# Patient Record
Sex: Female | Born: 2000 | Race: Black or African American | Hispanic: No | Marital: Single | Smoking: Never smoker
Health system: Southern US, Community
[De-identification: ages and names within clinical notes are randomized; demographics above are authoritative.]

## PROBLEM LIST (undated history)

## (undated) DIAGNOSIS — R569 Unspecified convulsions: Secondary | ICD-10-CM

## (undated) DIAGNOSIS — S069XAA Unspecified intracranial injury with loss of consciousness status unknown, initial encounter: Secondary | ICD-10-CM

## (undated) DIAGNOSIS — S069X9A Unspecified intracranial injury with loss of consciousness of unspecified duration, initial encounter: Secondary | ICD-10-CM

---
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
## 2018-10-14 IMAGING — CT CT HEAD W/O CM
4 series · 16 of 47 positions shown, 18 images · non-contrast
Comparison: None

CLINICAL DATA: Head injury due to syncope

EXAM:
CT HEAD WITHOUT CONTRAST
CT CERVICAL SPINE WITHOUT CONTRAST
TECHNIQUE: Multidetector CT imaging of the head and cervical spine was
performed following the standard protocol without intravenous
contrast. Multiplanar CT image reconstructions of the cervical spine
were also generated.

[Series 3: head without · axial · non-contrast · 0.41mm/px · z∈[-112,+8]mm · 7 of 34 slices shown, 9 images]
[im 5/34  brain]
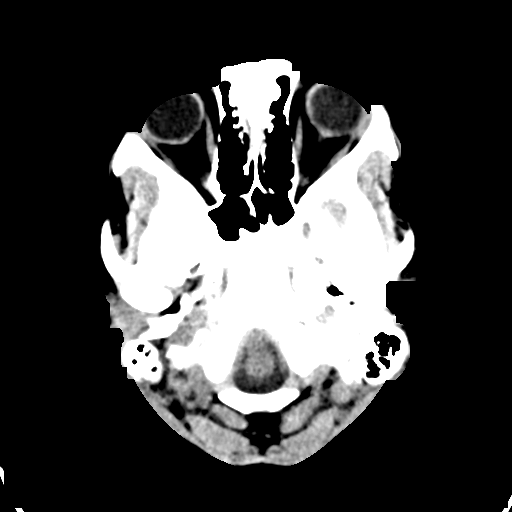
[im 5/34  bone]
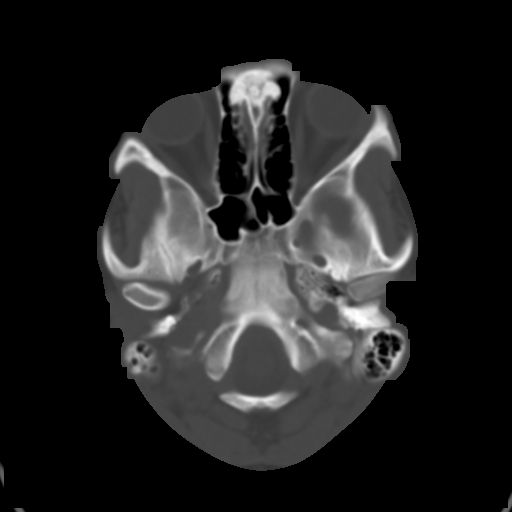
[im 9/34  brain]
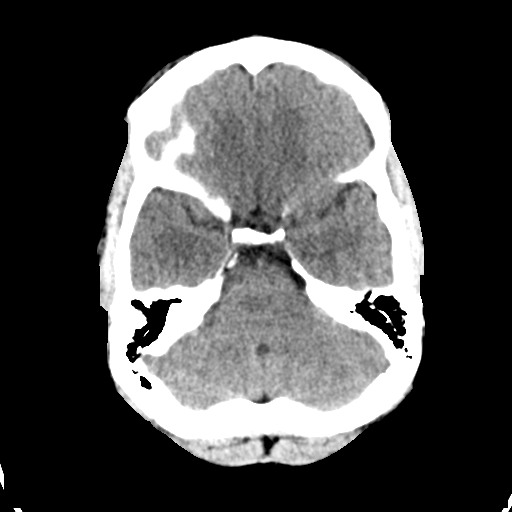
[im 13/34  brain]
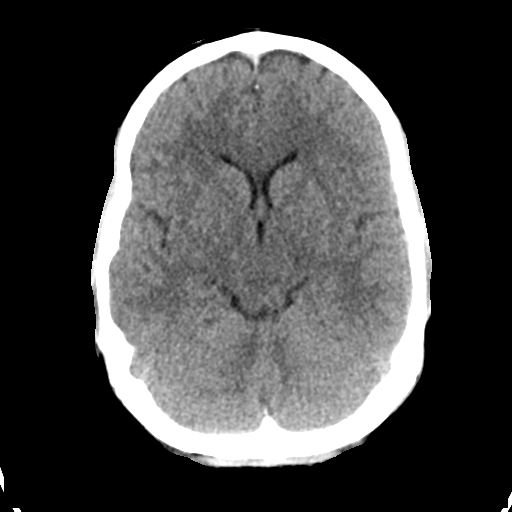
[im 17/34  brain]
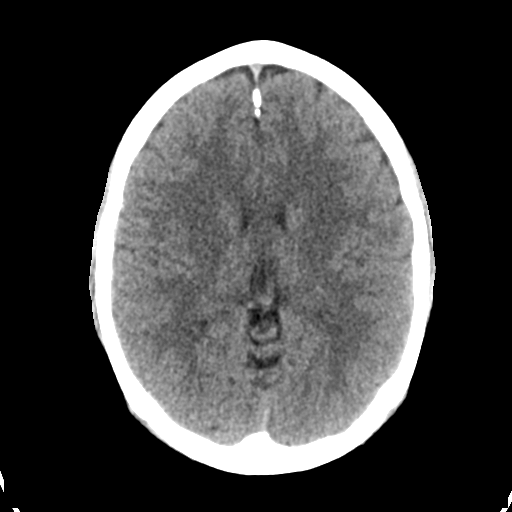
[im 21/34  brain]
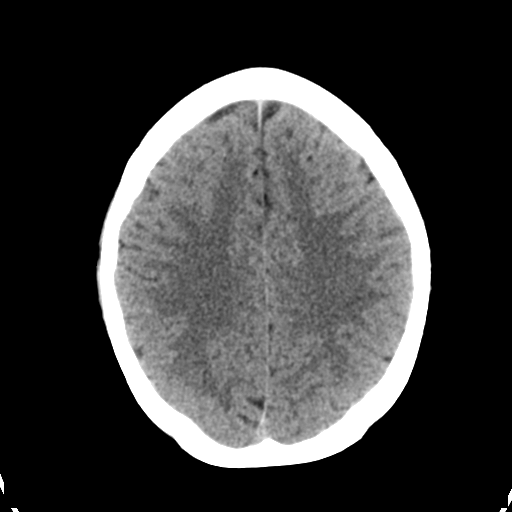
[im 21/34  bone]
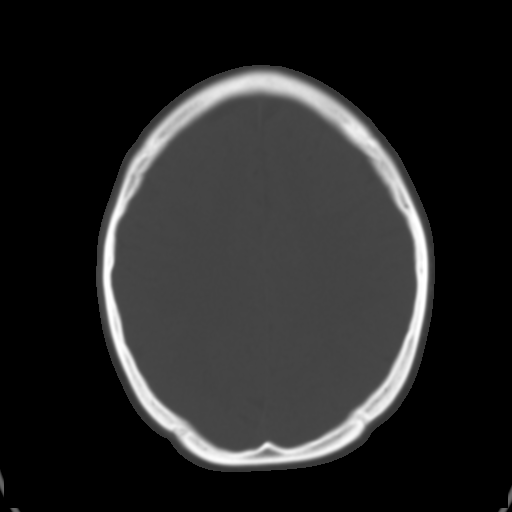
[im 25/34  brain]
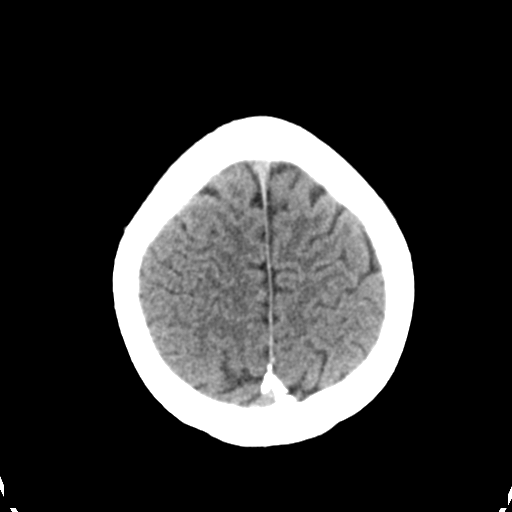
[im 29/34  brain]
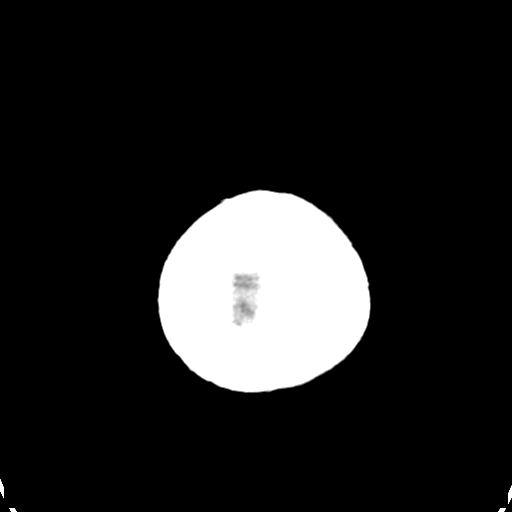

[Series 4: head bone · axial · 0.41mm/px · z∈[-116,-82]mm · 3 of 85 slices shown]
[im 9/85  bone]
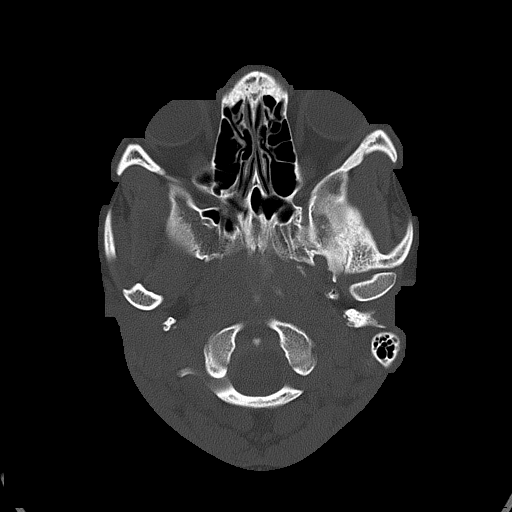
[im 17/85  bone]
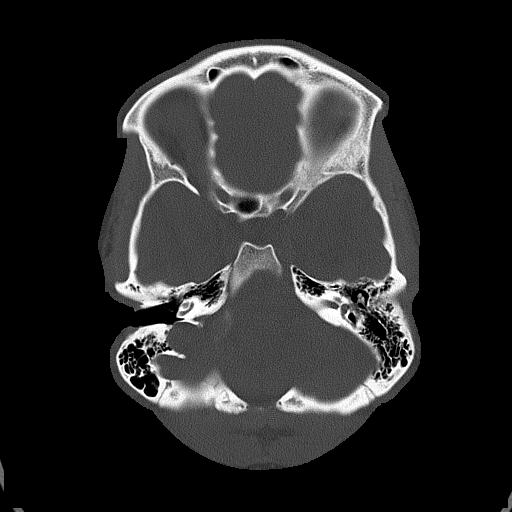
[im 26/85  bone]
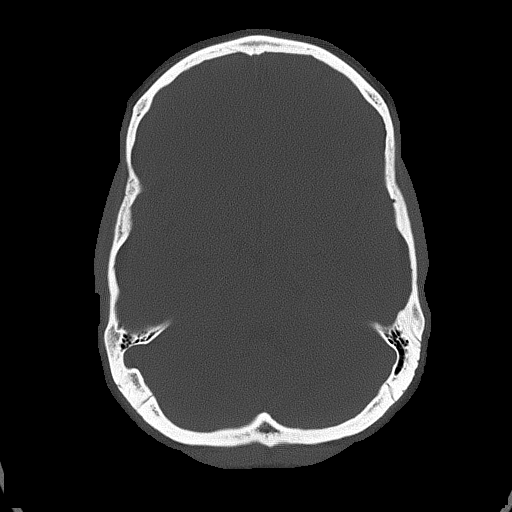

[Series 5: head without cor · coronal · non-contrast · 0.33mm/px · 3 of 68 slices shown]
[im 23/68  brain]
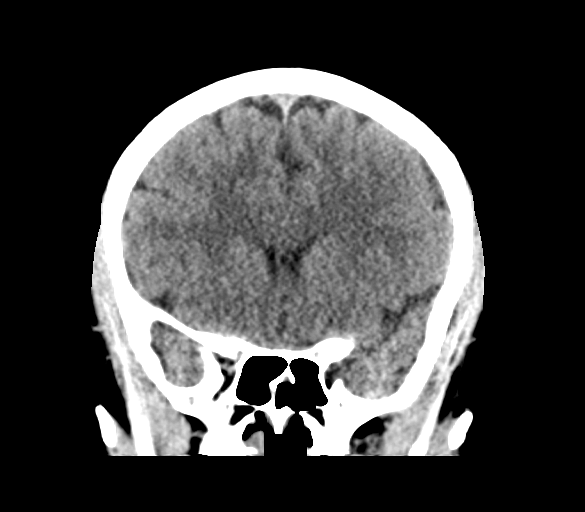
[im 30/68  brain]
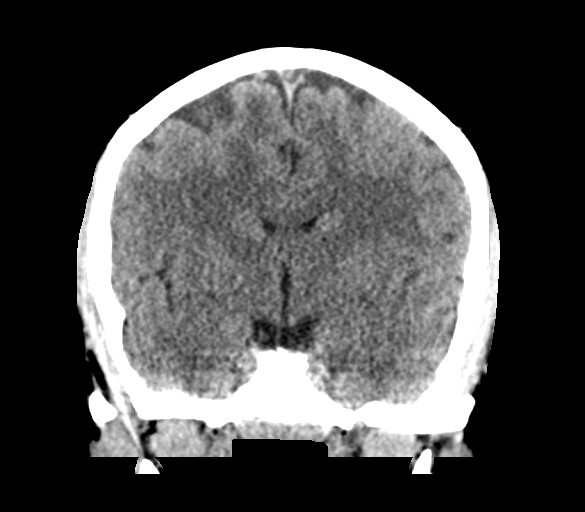
[im 38/68  brain]
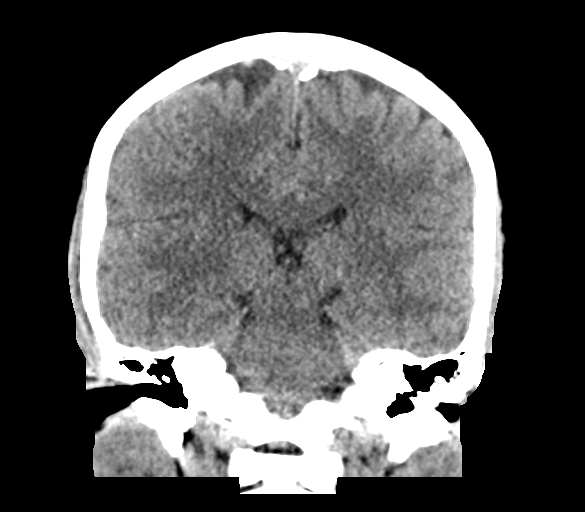

[Series 6: head without sag · sagittal · non-contrast · 0.33mm/px · 3 of 61 slices shown]
[im 21/61  brain]
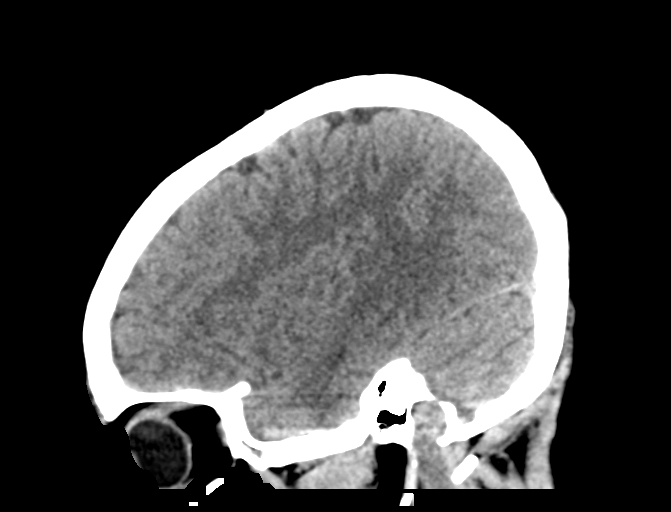
[im 31/61  brain]
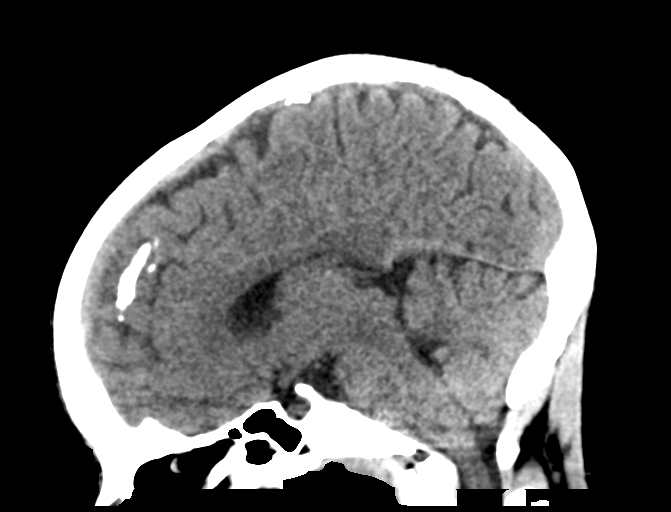
[im 41/61  brain]
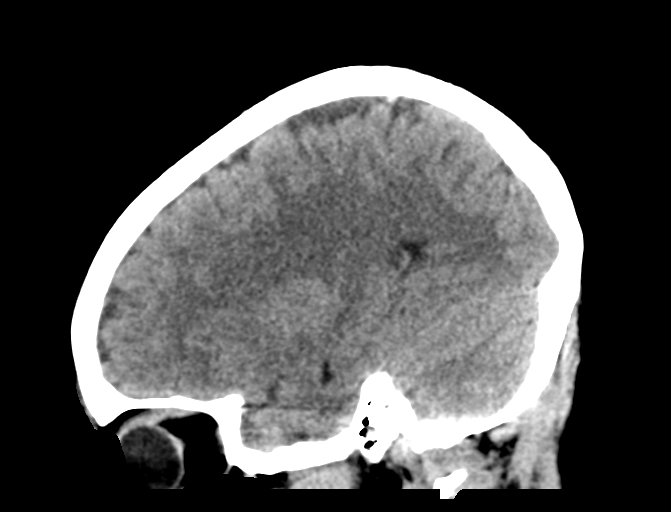

[16 of 47 positions shown; findings below may reference images not displayed]

FINDINGS: CT HEAD FINDINGS

Brain: Normal ventricular morphology. No midline shift or mass
effect. Normal appearance of brain parenchyma. No intracranial
hemorrhage, mass lesion, evidence of acute infarction, or
extra-axial fluid collection.

Vascular: Unremarkable

Skull: Intact

Sinuses/Orbits: Clear

Other: N/A

CT CERVICAL SPINE FINDINGS

Alignment: Normal

Skull base and vertebrae: Skullbase intact. Vertebral body heights
maintained without fracture or bone destruction.

Soft tissues and spinal canal: Prevertebral soft tissues normal
thickness. Visualized cervical soft tissues unremarkable.

Disc levels:  Maintained

Upper chest: Clear lung apices

Other: N/A
IMPRESSION: Normal CT head.

Normal CT cervical spine.

## 2020-08-20 ENCOUNTER — Encounter (HOSPITAL_COMMUNITY): Payer: Self-pay

## 2020-08-20 ENCOUNTER — Inpatient Hospital Stay (HOSPITAL_COMMUNITY)
Admission: AD | Admit: 2020-08-20 | Discharge: 2020-08-20 | Disposition: A | Discharge status: Home / Self Care | Payer: Self-pay | Attending: Obstetrics and Gynecology | Admitting: Obstetrics and Gynecology

## 2020-08-20 ENCOUNTER — Other Ambulatory Visit: Payer: Self-pay

## 2020-08-20 DIAGNOSIS — N939 Abnormal uterine and vaginal bleeding, unspecified: Secondary | ICD-10-CM | POA: Insufficient documentation

## 2020-08-20 DIAGNOSIS — Z3202 Encounter for pregnancy test, result negative: Secondary | ICD-10-CM | POA: Insufficient documentation

## 2020-08-20 DIAGNOSIS — R109 Unspecified abdominal pain: Secondary | ICD-10-CM | POA: Insufficient documentation

## 2020-08-20 LAB — HEMOGLOBIN AND HEMATOCRIT, BLOOD
HCT: 32.9 % — ABNORMAL LOW (ref 36.0–46.0)
Hemoglobin: 10.2 g/dL — ABNORMAL LOW (ref 12.0–15.0)

## 2020-08-20 LAB — POCT PREGNANCY, URINE: Preg Test, Ur: NEGATIVE

## 2020-08-20 LAB — HCG, QUANTITATIVE, PREGNANCY: hCG, Beta Chain, Quant, S: <1 m[IU]/mL (ref <5)

## 2020-08-20 LAB — ABO/RH: ABO/RH(D): O POS

## 2020-08-20 NOTE — MAU Note (Signed)
Shannon Arnold is a 19 y.o. here in MAU reporting: pt arrived via EMS complaining of bleeding and abdominal pain. States she woke up to a large spot of bleeding on her sheets and then sat on the toilet for 2 hours with bleeding. States she had many quarter sized clots while on the toilet. 3 + UPT a couple days ago.  LMP: 07/15/20  Onset of complaint: today  Pain score: 5/10  Vitals:   08/20/20 1304  BP: 119/78  Pulse: 66  Resp: 18  Temp: 98.7 F (37.1 C)  SpO2: 100%     Lab orders placed from triage: UPT

## 2020-08-20 NOTE — MAU Provider Note (Signed)
°  S:   19 y.o. No obstetric history on file. @Unknown  by LMP presents to MAU via EMS for vaginal bleeding. Reports positive home pregnancy test on Thursday. States bleeding started this afternoon. Saw multiple dime sized clots and bled into the toilet. Since then saw blood on toilet paper. Also reports some abdominal cramping.     O: BP 119/78 (BP Location: Right Arm)    Pulse 66    Temp 98.7 F (37.1 C) (Oral)    Resp 18    Ht 5' (1.524 m)    Wt 73.9 kg    LMP 07/15/2020    SpO2 100% Comment: room air   BMI 31.83 kg/m  Physical Examination: General appearance - alert, well appearing, and in no distress, oriented to person, place, and time and acyanotic, in no respiratory distress  Results for orders placed or performed during the hospital encounter of 08/20/20 (from the past 48 hour(s))  Pregnancy, urine POC     Status: None   Collection Time: 08/20/20 12:54 PM  Result Value Ref Range   Preg Test, Ur NEGATIVE NEGATIVE    Comment:        THE SENSITIVITY OF THIS METHODOLOGY IS >24 mIU/mL   ABO/Rh     Status: None   Collection Time: 08/20/20  1:36 PM  Result Value Ref Range   ABO/RH(D)      O POS Performed at [facility], [address], [place], [place]   hCG, quantitative, pregnancy     Status: None   Collection Time: 08/20/20  1:36 PM  Result Value Ref Range   hCG, Beta Chain, Quant, S <1 <5 mIU/mL    Comment:          GEST. AGE      CONC.  (mIU/mL)   <=1 WEEK        5 - 50     2 WEEKS       50 - 500     3 WEEKS       100 - 10,000     4 WEEKS     1,000 - 30,000     5 WEEKS     3,500 - 115,000   6-8 WEEKS     12,000 - 270,000    12 WEEKS     15,000 - 220,000        FEMALE AND NON-PREGNANT FEMALE:     LESS THAN 5 mIU/mL Performed at [facility], [address], [place], [place]   Hemoglobin and hematocrit, blood     Status: Abnormal   Collection Time: 08/20/20  1:36 PM  Result Value Ref Range   Hemoglobin 10.2 (L) 12.0 - 15.0 g/dL   HCT  08/22/20 (L) 36 - 46 %    Comment: Performed at [facility], [address], [place], [place]    A: 1. Negative pregnancy test   -negative HCG today. False positive urine test vs early miscarriage. RH positive.  -pt does not want a pregnancy in the next year but does not want contraception. Discussed condoms & calendar tracking   P: D/C home   [id number], NP 2:43 PM

## 2020-08-20 NOTE — Discharge Instructions (Signed)
Home Pregnancy Test Information ° °A home pregnancy test helps you determine whether you are pregnant or not. There are several types of home pregnancy tests that can be bought at a grocery store or pharmacy. °What is being tested? °A home pregnancy test detects the presence of a hormone in your urine. The hormone is produced by cells of the placenta (human chorionic gonadotropin, or hCG). The placenta is the organ that forms to nourish and support a developing baby. °How are pregnancy tests done? °Home pregnancy tests require a urine sample. °· Most kits use a plastic testing device with a strip of paper that indicates whether there is hCG in your urine. °· Follow the test package instructions very carefully for how to test your urine. Depending on the test, you may need to: °? Urinate directly onto the stick. °? Urinate into a cup. °· Wait for the results as directed by the package instructions. The amount of time may be different for each type of test. °· Follow the test package instructions for how to read your test results. Depending on the test, results may be displayed as: °? A plus or a minus sign. °? One or two lines. °? "Pregnant" or "not pregnant." °· For best results, use your first urine of the morning. That is when the concentration of hCG is highest. °How accurate are home pregnancy tests? °Home pregnancy tests are very accurate when: °· You are at least 3-[redacted] weeks pregnant. °· It has been 1-2 weeks since your missed period. °· You use the test according to the package instructions. °What can interfere with home pregnancy test results? °Sometimes, a home pregnancy test may report that you are pregnant when you are not pregnant (false-positive result). This can happen if you: °· Are taking certain medicines, such as: °? Medicine to control seizures. °? Anti-anxiety medicine. °? Fertility medicine with hCG. °· Have a medical condition that affects your hormone levels. °· Had a recent pregnancy loss  (miscarriage) or abortion. °Sometimes, a home pregnancy test may report that you are not pregnant when you are pregnant (false-negative result). This can happen if you: °· Took the test too early in your pregnancy. Before 3-4 weeks of pregnancy, there may not be enough hCG to detect. °· Drank a lot of liquid before the test. °· Used an expired pregnancy test. °· Are taking certain medicines, such as antihistamines or water pills (diuretics). °What should I do if I have a positive pregnancy test? °If you have a positive home pregnancy test, schedule an appointment with your health care provider. You might need additional testing to confirm the pregnancy. °What should I do if I have a negative pregnancy test? °If you have a negative home pregnancy test but still have symptoms of pregnancy, contact your health care provider. Your health care provider will test a sample of your blood to check for pregnancy. In some cases, a blood test will return a positive result even if a urine test was negative because blood tests are more sensitive. This means blood tests can detect hCG earlier than home pregnancy tests. °Follow these instructions at home: °If you are pregnant, planning to become pregnant, or think you may be pregnant: °· Do not drink alcohol. °· Do not use street drugs. °· Do not use any products that contain nicotine or tobacco, such as cigarettes and e-cigarettes. If you need help quitting, ask your health care provider. °· Take a prenatal vitamin that contains at least 400 mcg of folic   acid daily. °Summary °· A home pregnancy test helps you determine whether you are pregnant or not by detecting the presence of the hormone human chorionic gonadotropin (hCG) in a sample of your urine. °· Follow the test package instructions very carefully. For best results, use your first urine of the morning. That is when the concentration of hCG is highest. °· Home pregnancy tests are very accurate when you are 3-[redacted] weeks  pregnant or when it has been 1-2 weeks since your missed period. °· A home pregnancy test may report that you are pregnant when you are not pregnant or that you are not pregnant when you are pregnant. °· Contact your health care provider to confirm your results. Your health care provider will test a sample of your blood to check for pregnancy. °This information is not intended to replace advice given to you by your health care provider. Make sure you discuss any questions you have with your health care provider. °Document Revised: 03/11/2019 Document Reviewed: 12/01/2017 °Elsevier Patient Education © 2020 [employer] ° °

## 2024-04-12 ENCOUNTER — Emergency Department
Admission: EM | Admit: 2024-04-12 | Discharge: 2024-04-12 | Disposition: A | Discharge status: Home / Self Care | Payer: Self-pay | Attending: Emergency Medicine | Admitting: Emergency Medicine

## 2024-04-12 ENCOUNTER — Emergency Department: Payer: Self-pay

## 2024-04-12 ENCOUNTER — Other Ambulatory Visit: Payer: Self-pay

## 2024-04-12 DIAGNOSIS — E871 Hypo-osmolality and hyponatremia: Secondary | ICD-10-CM | POA: Insufficient documentation

## 2024-04-12 DIAGNOSIS — K5641 Fecal impaction: Secondary | ICD-10-CM | POA: Insufficient documentation

## 2024-04-12 DIAGNOSIS — R1084 Generalized abdominal pain: Secondary | ICD-10-CM

## 2024-04-12 LAB — CBC WITH DIFFERENTIAL/PLATELET
Abs Immature Granulocytes: 0.03 10*3/uL (ref 0.00–0.07)
Basophils Absolute: 0 10*3/uL (ref 0.0–0.1)
Basophils Relative: 0 %
Eosinophils Absolute: 0 10*3/uL (ref 0.0–0.5)
Eosinophils Relative: 0 %
HCT: 29.1 % — ABNORMAL LOW (ref 36.0–46.0)
Hemoglobin: 8.4 g/dL — ABNORMAL LOW (ref 12.0–15.0)
Immature Granulocytes: 0 %
Lymphocytes Relative: 17 %
Lymphs Abs: 1.8 10*3/uL (ref 0.7–4.0)
MCH: 18.6 pg — ABNORMAL LOW (ref 26.0–34.0)
MCHC: 28.9 g/dL — ABNORMAL LOW (ref 30.0–36.0)
MCV: 64.4 fL — ABNORMAL LOW (ref 80.0–100.0)
Monocytes Absolute: 0.4 10*3/uL (ref 0.1–1.0)
Monocytes Relative: 4 %
Neutro Abs: 8.1 10*3/uL — ABNORMAL HIGH (ref 1.7–7.7)
Neutrophils Relative %: 79 %
Platelets: 517 10*3/uL — ABNORMAL HIGH (ref 150–400)
RBC: 4.52 MIL/uL (ref 3.87–5.11)
RDW: 21.7 % — ABNORMAL HIGH (ref 11.5–15.5)
Smear Review: NORMAL
WBC: 10.4 10*3/uL (ref 4.0–10.5)
nRBC: 0 % (ref 0.0–0.2)

## 2024-04-12 LAB — COMPREHENSIVE METABOLIC PANEL WITH GFR
ALT: 22 U/L (ref 0–44)
AST: 26 U/L (ref 15–41)
Albumin: 4.2 g/dL (ref 3.5–5.0)
Alkaline Phosphatase: 51 U/L (ref 38–126)
Anion gap: 9 (ref 5–15)
BUN: 8 mg/dL (ref 6–20)
CO2: 23 mmol/L (ref 22–32)
Calcium: 9.1 mg/dL (ref 8.9–10.3)
Chloride: 102 mmol/L (ref 98–111)
Creatinine, Ser: 0.69 mg/dL (ref 0.44–1.00)
GFR, Estimated: >60 mL/min (ref >60)
Glucose, Bld: 114 mg/dL — ABNORMAL HIGH (ref 70–99)
Potassium: 4 mmol/L (ref 3.5–5.1)
Sodium: 134 mmol/L — ABNORMAL LOW (ref 135–145)
Total Bilirubin: 0.8 mg/dL (ref 0.0–1.2)
Total Protein: 7.9 g/dL (ref 6.5–8.1)

## 2024-04-12 LAB — LIPASE, BLOOD: Lipase: 27 U/L (ref 11–51)

## 2024-04-12 LAB — HCG, QUANTITATIVE, PREGNANCY: hCG, Beta Chain, Quant, S: <1 m[IU]/mL (ref <5)

## 2024-04-12 MED ORDER — DIPHENHYDRAMINE HCL 50 MG/ML IJ SOLN
12.5000 mg | INTRAMUSCULAR | Status: AC
  Administered 2024-04-12: 12.5 mg via INTRAVENOUS
  Filled 2024-04-12: qty 1

## 2024-04-12 MED ORDER — LIDOCAINE VISCOUS HCL 2 % MT SOLN
15.0000 mL | Freq: Once | OROMUCOSAL | Status: AC
  Administered 2024-04-12: 15 mL via OROMUCOSAL
  Filled 2024-04-12: qty 15

## 2024-04-12 MED ORDER — MAGNESIUM CITRATE PO SOLN
1.0000 | Freq: Once | ORAL | Status: AC
  Administered 2024-04-12: 1 via ORAL
  Filled 2024-04-12: qty 296

## 2024-04-12 MED ORDER — IOHEXOL 300 MG/ML  SOLN
100.0000 mL | Freq: Once | INTRAMUSCULAR | Status: AC | PRN
  Administered 2024-04-12: 100 mL via INTRAVENOUS

## 2024-04-12 MED ORDER — DOCUSATE SODIUM 50 MG/5ML PO LIQD
100.0000 mg | ORAL | Status: AC
  Administered 2024-04-12: 100 mg
  Filled 2024-04-12: qty 10

## 2024-04-12 MED ORDER — KETOROLAC TROMETHAMINE 30 MG/ML IJ SOLN
15.0000 mg | Freq: Once | INTRAMUSCULAR | Status: AC
  Administered 2024-04-12: 15 mg via INTRAVENOUS
  Filled 2024-04-12: qty 1

## 2024-04-12 MED ORDER — DROPERIDOL 2.5 MG/ML IJ SOLN
2.5000 mg | Freq: Once | INTRAMUSCULAR | Status: AC
  Administered 2024-04-12: 2.5 mg via INTRAVENOUS
  Filled 2024-04-12: qty 2

## 2024-04-12 NOTE — Discharge Instructions (Signed)
 You were seen in the emergency department today for abdominal pain that we believe was due to constipation.  You originally had a large ball of stool in your rectum that was preventing the stool above it from coming out.  However, that issue seems to have resolved.  We recommend that you use one or more of the following over-the-counter medications in the order described:   1)  Miralax (powder):  This medication works by drawing additional fluid into your intestines and helps to flush out your stool.  Mix the powder with water or juice according to label instructions.  Be sure to use the recommended amount of water or juice when you mix up the powder.  Plenty of fluids will help to prevent constipation. 2)  Colace (or Dulcolax) 100 mg:  This is a stool softener, and you may take it once or twice a day as needed. 3)  Senna tablets:  This is a bowel stimulant that will help "push" out your stool. It is the next step to add after you have tried a stool softener. 4)  Magnesium citrate: This is typically sold in a clear glass bottle also purchased without the need for prescription.  You can drink the bottle and it typically stimulates your bowels within a short period of time.  You may also want to consider using glycerin suppositories, which you insert into your rectum.  You hold it in place and is dissolves and softens your stool and stimulates your bowels.  You could also consider using an enema, which is also available over the counter.  Remember that narcotic pain medications are constipating, so avoid them or minimize their use.  Drink plenty of fluids.  Please return to the Emergency Department immediately if you develop new or worsening symptoms that concern you, such as (but not limited to) fever > 101 degrees, severe abdominal pain, or persistent vomiting.

## 2024-04-12 NOTE — ED Provider Notes (Signed)
 [facility] Provider Note    Event Date/Time   First MD Initiated Contact with Patient 04/12/24 [phone]     (approximate)   History   Abdominal Pain   HPI Shannon Arnold is a 23 y.o. female who presents with severe abdominal pain.  She reports that she has been constipated and took some products to help her have a bowel movement.  While she was at home, she had acute onset of lower abdominal pain, passed out, and then woke up on the floor surrounded by loose stool.  She said that she does not have any nausea or vomiting but her abdomen continues to hurt very badly.  She has not had anything like this in the past.  Her last menstrual cycle was about a month ago but she said that she is not sexually active for more than 6 months.  She has had no fever, chest pain, shortness of breath, nor burning when she urinates.     Physical Exam   Triage Vital Signs: ED Triage Vitals  Encounter Vitals Group     BP 04/12/24 0104 114/73     Systolic BP Percentile --      Diastolic BP Percentile --      Pulse Rate 04/12/24 0101 73     Resp 04/12/24 0101 20     Temp 04/12/24 0101 99 F (37.2 C)     Temp Source 04/12/24 0101 Oral     SpO2 04/12/24 0101 100 %     Weight 04/12/24 0101 74.8 kg (165 lb)     Height 04/12/24 0101 1.524 m (5')     Head Circumference --      Peak Flow --      Pain Score 04/12/24 0100 8     Pain Loc --      Pain Education --      Exclude from Growth Chart --     Most recent vital signs: Vitals:   04/12/24 0104 04/12/24 0637  BP: 114/73 118/82  Pulse:  78  Resp:  18  Temp:  98.9 F (37.2 C)  SpO2:  99%    General: Awake, appears to be in significant pain. CV:  Good peripheral perfusion.  Regular rate and rhythm. Resp:  Normal effort. Speaking easily and comfortably, no accessory muscle usage nor intercostal retractions.   Abd:  Abdomen is soft but tender throughout and she guards and grabs my hand when I try to palpate her.   ED  Results / Procedures / Treatments   Labs (all labs ordered are listed, but only abnormal results are displayed) Labs Reviewed  CBC WITH DIFFERENTIAL/PLATELET - Abnormal; Notable for the following components:      Result Value   Hemoglobin 8.4 (*)    HCT 29.1 (*)    MCV 64.4 (*)    MCH 18.6 (*)    MCHC 28.9 (*)    RDW 21.7 (*)    Platelets 517 (*)    Neutro Abs 8.1 (*)    All other components within normal limits  COMPREHENSIVE METABOLIC PANEL WITH GFR - Abnormal; Notable for the following components:   Sodium 134 (*)    Glucose, Bld 114 (*)    All other components within normal limits  LIPASE, BLOOD  HCG, QUANTITATIVE, PREGNANCY  URINALYSIS, ROUTINE W REFLEX MICROSCOPIC  POC URINE PREG, ED      RADIOLOGY See ED course regarding imaging results   PROCEDURES:  Critical Care performed: No  .Fecal  disimpaction  Date/Time: 04/12/2024 5:45 AM  Performed by: Lynnda Sas, MD Authorized by: Lynnda Sas, MD  Consent: Verbal consent obtained. Risks and benefits: risks, benefits and alternatives were discussed Patient identity confirmed: verbally with patient Local anesthesia used: yes  Anesthesia: Local anesthesia used: yes Local Anesthetic: topical anesthetic  Sedation: Patient sedated: no  Patient tolerance: patient tolerated the procedure well with no immediate complications Comments: No palpable stool ball within rectum; only soft stool is present       IMPRESSION / MDM / ASSESSMENT AND PLAN / ED COURSE  I reviewed the triage vital signs and the nursing notes.                              Differential diagnosis includes, but is not limited to, enteritis/colitis, appendicitis, diverticulitis, ovarian cyst, ovarian torsion.  Patient's presentation is most consistent with acute presentation with potential threat to life or bodily function.  Labs/studies ordered: CBC with differential, CMP, lipase, CT abdomen/pelvis, hCG,  urinalysis  Interventions/Medications given:  Medications  droperidol (INAPSINE) 2.5 MG/ML injection 2.5 mg (2.5 mg Intravenous Given 04/12/24 0242)  ketorolac (TORADOL) 30 MG/ML injection 15 mg (15 mg Intravenous Given 04/12/24 0240)  diphenhydrAMINE (BENADRYL) injection 12.5 mg (12.5 mg Intravenous Given 04/12/24 0242)  iohexol (OMNIPAQUE) 300 MG/ML solution 100 mL (100 mLs Intravenous Contrast Given 04/12/24 0257)  lidocaine  (XYLOCAINE ) 2 % viscous mouth solution 15 mL (15 mLs Mouth/Throat Given by Other 04/12/24 3086)  docusate (COLACE) 50 MG/5ML liquid 100 mg (100 mg Per Tube Given 04/12/24 0624)  magnesium citrate solution 1 Bottle (1 Bottle Oral Given 04/12/24 5784)    (Note:  hospital course my include additional interventions and/or labs/studies not listed above.)   Vital signs normal.  Patient is in significant discomfort but if she has been severely constipated recently and that has something to do with the discomfort and the episode she had, I am reluctant to give her narcotics and possibly worsen the issue.  I ordered Toradol 15 mg IV and droperidol 2.5 mg IV as well as Benadryl 12.5 mg IV.  I think this should be a good, nation to help ease her discomfort and allow for better examination.  Labs are generally reassuring with no acute abnormalities.  She is anemic, lower than baseline, but this is of unclear clinical significance.  No LFT elevation and no leukocytosis.  Will reassess after medication and imaging.   Clinical Course as of 04/12/24 0724  Mon Apr 12, 2024  0506 CT ABDOMEN PELVIS W CONTRAST I viewed and interpreted the patient's CT scan and at first I thought that there were air-fluid levels.  However the radiologist identified no evidence of small bowel obstruction, but rather the patient has some layering and fluid-filled colon above a rectum distended with stool.  This is consistent with her history of constipation.  I reassessed the patient and she has been sleeping.  She  says she feels little bit better.  I explained the situation and offered manual disimpaction versus enema.  She cannot decide what she wants to try, so she wants to think about it.  Will reassess. [CF]  0509 Patient request digital disimpaction.  Will proceed using viscous lidocaine  as a lubricant and local anesthetic. [CF]  [phone] Patient tolerated manual disimpaction well, but unfortunately there was no large stool ball within the rectum that I could reach with my finger.  We discussed that and she agreed to proceed with  enema. [CF]  8119 Patient did not tolerate the enema well.  However, when I went to reassess her, she said that she did have a large bowel movement and is ready to go home.  I recommended an aggressive OTC bowel regimen at home and I gave my usual and customary follow-up recommendations and return precautions. [CF]    Clinical Course User Index [CF] Lynnda Sas, MD     FINAL CLINICAL IMPRESSION(S) / ED DIAGNOSES   Final diagnoses:  Fecal impaction in rectum [facility])  Generalized abdominal pain     Rx / DC Orders   ED Discharge Orders     None        Note:  This document was prepared using Dragon voice recognition software and may include unintentional dictation errors.   Lynnda Sas, MD 04/12/24 [phone]

## 2024-04-12 NOTE — ED Triage Notes (Signed)
 BIB [facility] from home with CC of constipation x3 days - took liquid bottle of laxative and stool softners. Had sharp lower abd pain, syncopal episode, and woke up on floor surrounded by loose stool. A&Ox4.

## 2024-04-12 NOTE — ED Notes (Signed)
 The ordered enema was administered to the patient by this nurse, and Doylene Genet, NT.  Patient did not tolerate very well, and was very uncomfortable as the fluid was released into the rectum.  The tube of the enema kit was advanced until resistance from the stool burden made advancement too difficult.  The patient was only able to retain the contents of the enema for approximately 30 seconds before she released it all.  No noticeable amount of stool emerged with the fluid.  Dr. Lajuana Pilar has been updated.
